# Patient Record
Sex: Male | Born: 1975 | Hispanic: Yes | Marital: Single
Health system: Southern US, Community
[De-identification: ages and names within clinical notes are randomized; demographics above are authoritative.]

---

## 2006-10-21 ENCOUNTER — Emergency Department: Payer: Self-pay | Admitting: Emergency Medicine

## 2007-12-20 ENCOUNTER — Emergency Department: Payer: Self-pay | Admitting: Unknown Physician Specialty

## 2008-11-12 IMAGING — CR RIGHT ANKLE - COMPLETE 3+ VIEW
1 series · 5 of 5 positions shown · non-contrast
Comparison: none

REASON FOR EXAM: Lower leg wedged between two pieces of metal
COMMENTS:

PROCEDURE:     DXR - DXR ANKLE RIGHT COMPLETE  - October 21, 2006  [DATE]
RESULT:     Images of the RIGHT ankle demonstrate no fracture, dislocation
or radiopaque foreign body.

[Series 1: view not recorded · 0.17mm/px · 5 of 5 slices shown]
[im 1/5]
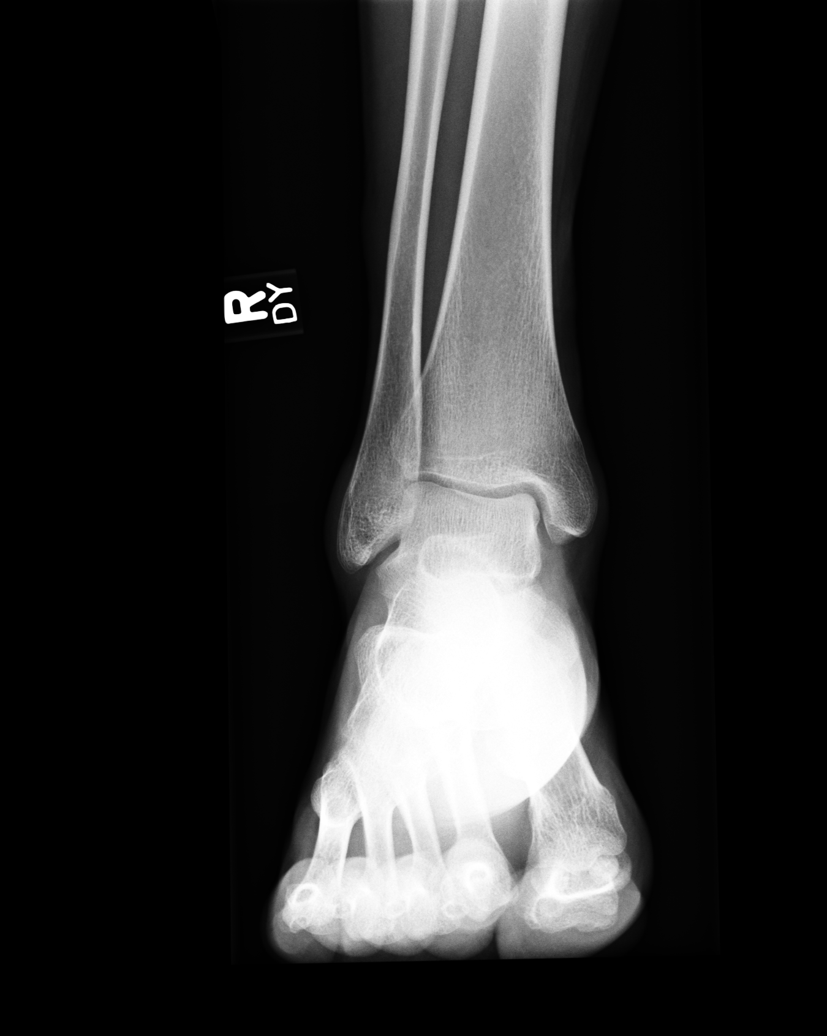
[im 2/5]
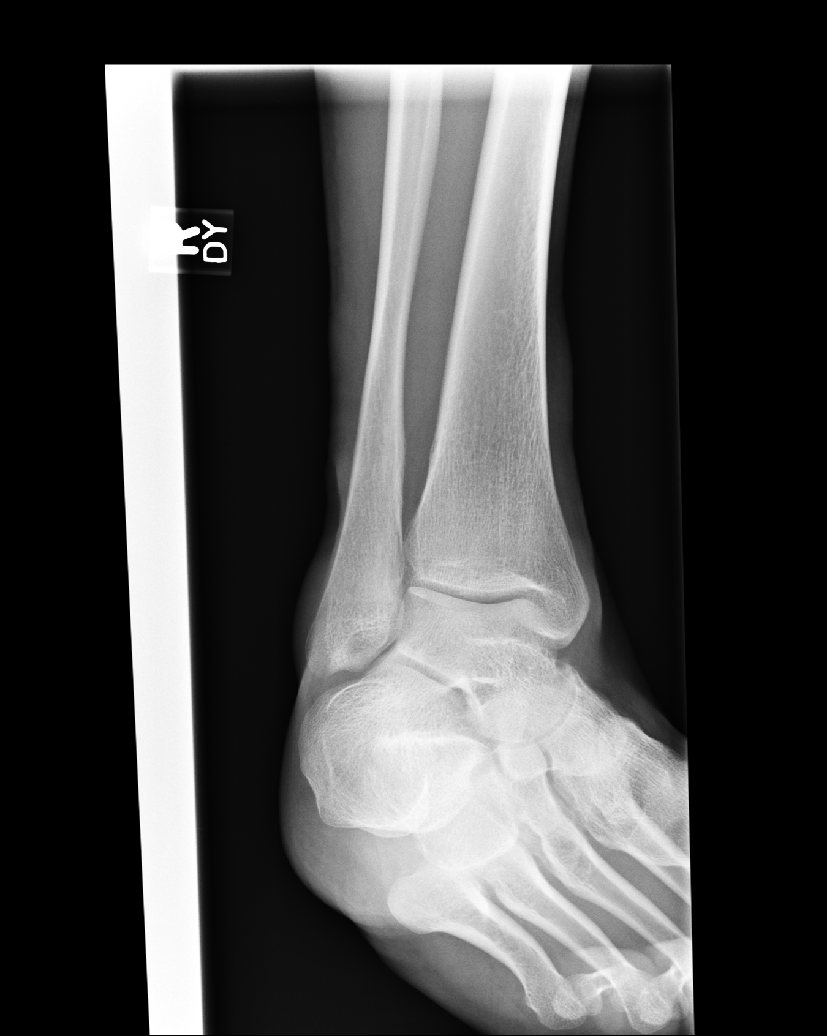
[im 3/5]
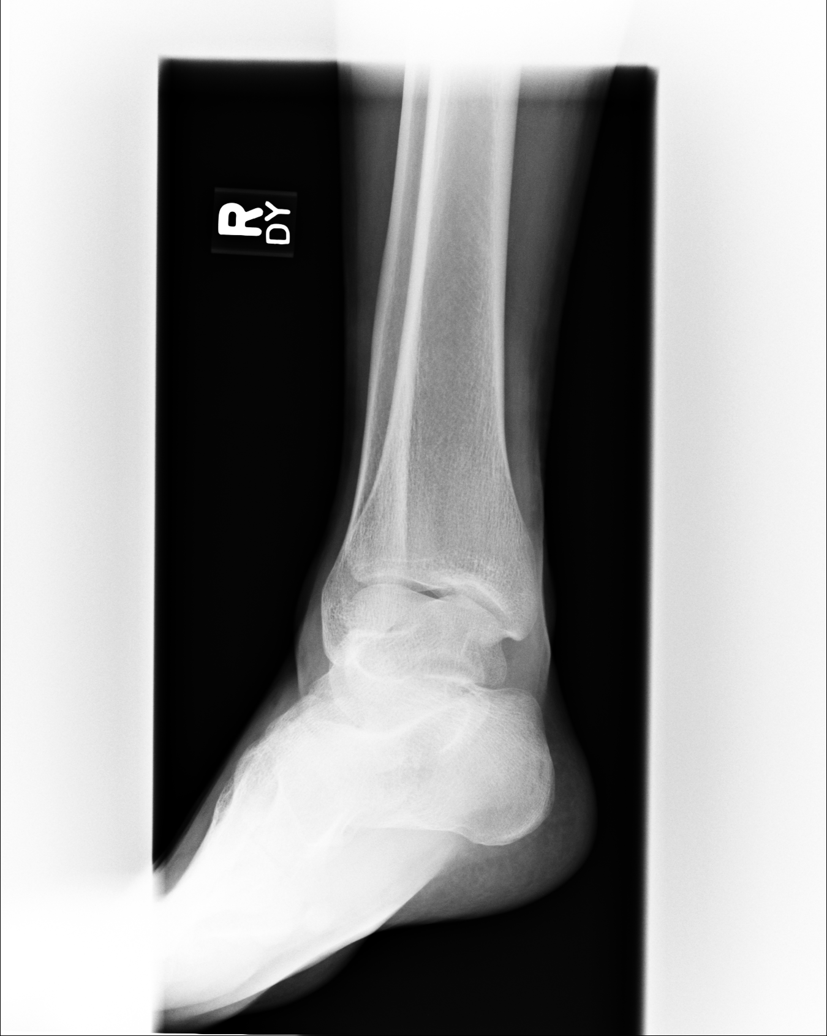
[im 4/5]
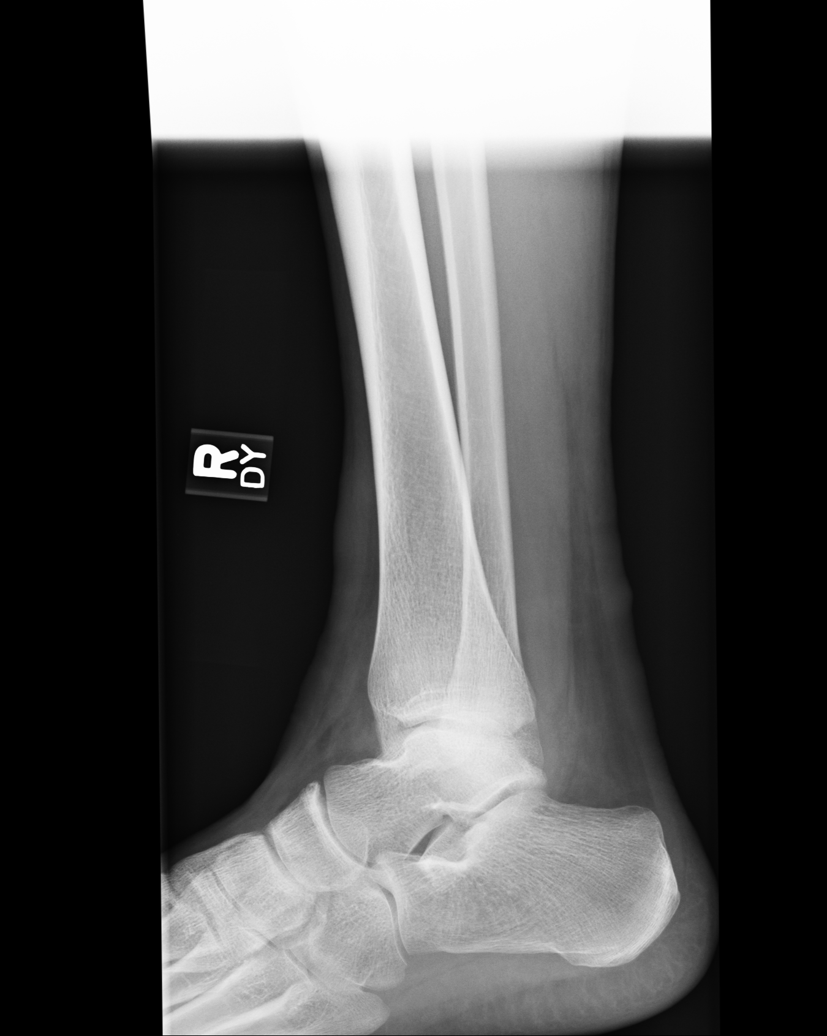
[im 5/5]
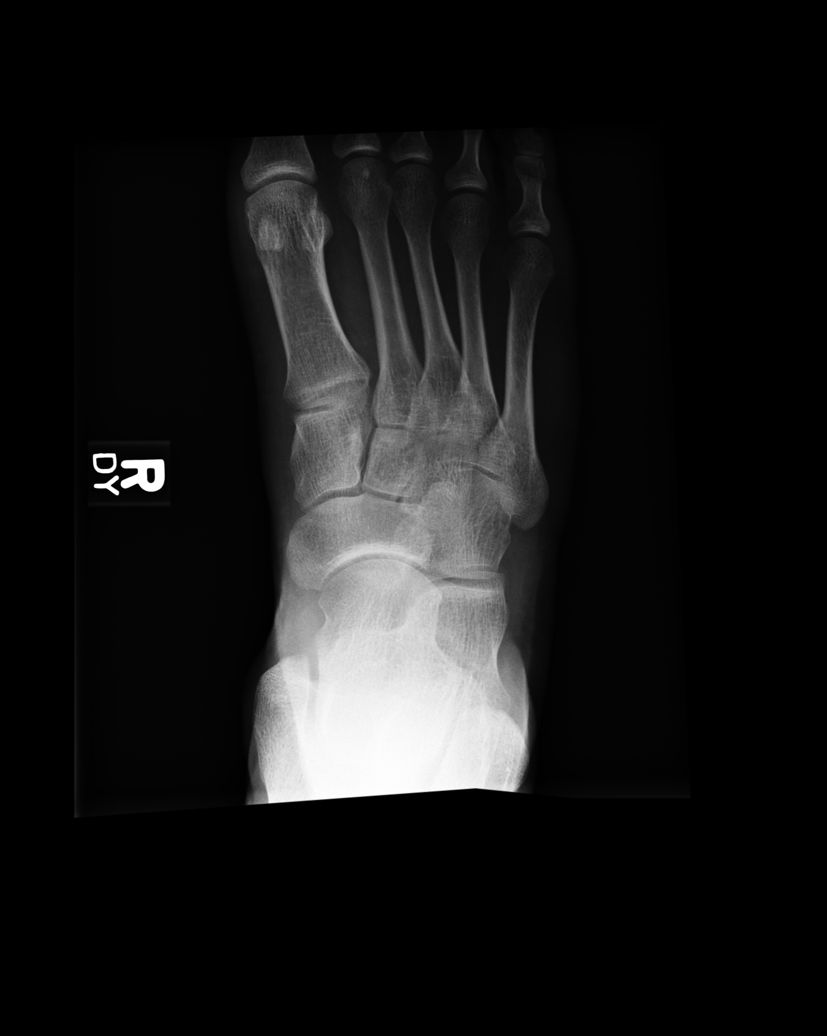

[5 of 5 positions shown; findings below may reference images not displayed]

IMPRESSION: No acute bony abnormality of the RIGHT ankle.

## 2018-01-05 ENCOUNTER — Other Ambulatory Visit: Payer: Self-pay

## 2018-01-05 ENCOUNTER — Emergency Department
Admission: EM | Admit: 2018-01-05 | Discharge: 2018-01-05 | Disposition: A | Discharge status: Home / Self Care | Payer: Self-pay | Attending: Emergency Medicine | Admitting: Emergency Medicine

## 2018-01-05 DIAGNOSIS — H1032 Unspecified acute conjunctivitis, left eye: Secondary | ICD-10-CM

## 2018-01-05 DIAGNOSIS — H10022 Other mucopurulent conjunctivitis, left eye: Secondary | ICD-10-CM | POA: Insufficient documentation

## 2018-01-05 MED ORDER — POLYMYXIN B-TRIMETHOPRIM 10000-0.1 UNIT/ML-% OP SOLN: 2.0000 [drp] | OPHTHALMIC | 0 refills | Status: AC

## 2018-01-05 NOTE — ED Provider Notes (Signed)
[facility] Emergency Department Provider Note  ____________________________________________  Time seen: Approximately 5:50 PM  I have reviewed the triage vital signs and the nursing notes.   HISTORY  Chief Complaint Conjunctivitis    HPI Peter Avila is a 42 y.o. male presents to the emergency department with left eye conjunctivitis for the past 2 to 3 days.  Patient has had crusting and increased tearing when he wakes up.  No other sick contacts in the home.  No changes in vision.  Patient denies pain with extraocular eye muscle movement.  No foreign body sensation or difficulty keeping the left eye open.  Patient has not had recent similar symptoms in the past.  No alleviating measures have been attempted.    No past medical history on file.  There are no active problems to display for this patient.     Prior to Admission medications   Medication Sig Start Date End Date Taking? Authorizing Provider  trimethoprim-polymyxin b (POLYTRIM) ophthalmic solution Place 2 drops into the left eye every 4 (four) hours for 7 days. 01/05/18 01/12/18  Orvil FeilWoods, Raffael Bugarin M, PA-C    Allergies Patient has no allergy information on record.  No family history on file.  Social History Social History   Tobacco Use  . Smoking status: Not on file  Substance Use Topics  . Alcohol use: Not on file  . Drug use: Not on file     Review of Systems  Constitutional: No fever/chills Eyes: Patient has left eye conjunctivitis. ENT: No upper respiratory complaints. Cardiovascular: no chest pain. Respiratory: no cough. No SOB. Gastrointestinal: No abdominal pain.  No nausea, no vomiting.  No diarrhea.  No constipation. Musculoskeletal: Negative for musculoskeletal pain. Skin: Negative for rash, abrasions, lacerations, ecchymosis. Neurological: Negative for headaches, focal weakness or numbness.  ____________________________________________   PHYSICAL  EXAM:  VITAL SIGNS: ED Triage Vitals  Enc Vitals Group     BP 01/05/18 1659 (!) 144/93     Pulse Rate 01/05/18 1659 97     Resp 01/05/18 1659 16     Temp 01/05/18 1659 (!) 97.5 F (36.4 C)     Temp Source 01/05/18 1659 Oral     SpO2 01/05/18 1659 96 %     Weight 01/05/18 1712 180 lb (81.6 kg)     Height --      Head Circumference --      Peak Flow --      Pain Score 01/05/18 1711 5     Pain Loc --      Pain Edu? --      Excl. in GC? --      Constitutional: Alert and oriented. Well appearing and in no acute distress. Eyes: Left eye conjunctivitis visualized.  Patient does have some mild edema of the left lower eyelid.  Crusting and matting of the eyelashes visualized, left.  PERRL. EOMI. Head: Atraumatic. ENT:      Ears: TMs are pearly.       Nose: No congestion/rhinnorhea.      Mouth/Throat: Mucous membranes are moist.  Neck: No stridor.  No cervical spine tenderness to palpation. Cardiovascular: Normal rate, regular rhythm. Normal S1 and S2.  Good peripheral circulation. Respiratory: Normal respiratory effort without tachypnea or retractions. Lungs CTAB. Good air entry to the bases with no decreased or absent breath sounds.  Skin:  Skin is warm, dry and intact. No rash noted.  ____________________________________________   LABS (all labs ordered are listed, but only abnormal results are  displayed)  Labs Reviewed - No data to display ____________________________________________  EKG   ____________________________________________  RADIOLOGY   No results found.  ____________________________________________    PROCEDURES  Procedure(s) performed:    Procedures    Medications - No data to display   ____________________________________________   INITIAL IMPRESSION / ASSESSMENT AND PLAN / ED COURSE  Pertinent labs & imaging results that were available during my care of the patient were reviewed by me and considered in my medical decision making (see  chart for details).  Review of the [place] CSRS was performed in accordance of the [facility] prior to dispensing any controlled drugs.      Assessment and plan Left eye conjunctivitis Patient presents to the emergency department with left eye conjunctivitis for the past 2 days with associated matting and crusting.  No visual changes, pain with extraocular eye muscle movement or foreign body sensation that would warrant further work-up.  Patient was discharged with Polytrim ophthalmic solution.  He was advised to follow-up with primary care as needed.  Strict return precautions were given to return with new or worsening symptoms.  All patient questions were answered.     ____________________________________________  FINAL CLINICAL IMPRESSION(S) / ED DIAGNOSES  Final diagnoses:  Acute bacterial conjunctivitis of left eye      NEW MEDICATIONS STARTED DURING THIS VISIT:  ED Discharge Orders         Ordered    trimethoprim-polymyxin b (POLYTRIM) ophthalmic solution  Every 4 hours     01/05/18 1721              This chart was dictated using voice recognition software/Dragon. Despite best efforts to proofread, errors can occur which can change the meaning. Any change was purely unintentional.    Orvil FeilWoods, Kaitlyn Skowron M, PA-C 01/05/18 1754    Arnaldo NatalMalinda, Paul F, MD 01/05/18 [id number]

## 2018-01-05 NOTE — ED Triage Notes (Signed)
L eye swelling and redness x few days. Needs spanish interpreter.
